# Patient Record
Sex: Female | Born: 1955 | Race: White | Hispanic: No | Marital: Married
Health system: Southern US, Community
[De-identification: ages and names within clinical notes are randomized; demographics above are authoritative.]

## PROBLEM LIST (undated history)

## (undated) HISTORY — PX: TONSILLECTOMY: SUR1361

---

## 2000-03-30 ENCOUNTER — Ambulatory Visit (HOSPITAL_COMMUNITY): Admission: RE | Admit: 2000-03-30 | Discharge: 2000-03-30 | Payer: Self-pay | Admitting: Obstetrics & Gynecology

## 2001-08-05 ENCOUNTER — Other Ambulatory Visit: Admission: RE | Admit: 2001-08-05 | Discharge: 2001-08-05 | Payer: Self-pay | Admitting: Obstetrics & Gynecology

## 2002-03-20 ENCOUNTER — Inpatient Hospital Stay (HOSPITAL_COMMUNITY): Admission: AD | Admit: 2002-03-20 | Discharge: 2002-03-23 | Payer: Self-pay | Admitting: Obstetrics & Gynecology

## 2002-03-24 ENCOUNTER — Encounter: Admission: RE | Admit: 2002-03-24 | Discharge: 2002-04-23 | Payer: Self-pay | Admitting: Obstetrics & Gynecology

## 2002-04-30 ENCOUNTER — Other Ambulatory Visit: Admission: RE | Admit: 2002-04-30 | Discharge: 2002-04-30 | Payer: Self-pay | Admitting: Obstetrics & Gynecology

## 2003-10-29 ENCOUNTER — Other Ambulatory Visit: Admission: RE | Admit: 2003-10-29 | Discharge: 2003-10-29 | Payer: Self-pay | Admitting: Obstetrics & Gynecology

## 2010-05-01 ENCOUNTER — Encounter: Payer: Self-pay | Admitting: Obstetrics & Gynecology

## 2013-01-22 ENCOUNTER — Other Ambulatory Visit: Payer: Self-pay | Admitting: Orthopedic Surgery

## 2013-01-22 DIAGNOSIS — M25551 Pain in right hip: Secondary | ICD-10-CM

## 2013-01-30 ENCOUNTER — Ambulatory Visit
Admission: RE | Admit: 2013-01-30 | Discharge: 2013-01-30 | Disposition: A | Discharge status: Home / Self Care | Payer: 59 | Source: Ambulatory Visit | Attending: Orthopedic Surgery | Admitting: Orthopedic Surgery

## 2013-01-30 DIAGNOSIS — M25551 Pain in right hip: Secondary | ICD-10-CM

## 2013-01-30 MED ORDER — IOHEXOL 180 MG/ML  SOLN
13.0000 mL | Freq: Once | INTRAMUSCULAR | Status: AC | PRN
  Administered 2013-01-30: 13 mL via INTRA_ARTICULAR

## 2013-04-01 ENCOUNTER — Encounter: Payer: Self-pay | Admitting: *Deleted

## 2013-04-08 ENCOUNTER — Ambulatory Visit: Payer: 59 | Admitting: Cardiovascular Disease

## 2014-08-02 IMAGING — RF DG FLUORO GUIDE NDL PLC/BX
2 series · 2 of 2 positions shown · non-contrast
Comparison: none

CLINICAL DATA: Right hip pain. Labral tear.

[Series 1: (hospital) · 1 of 1 slices shown (1 of 2)]
[im 1/1]
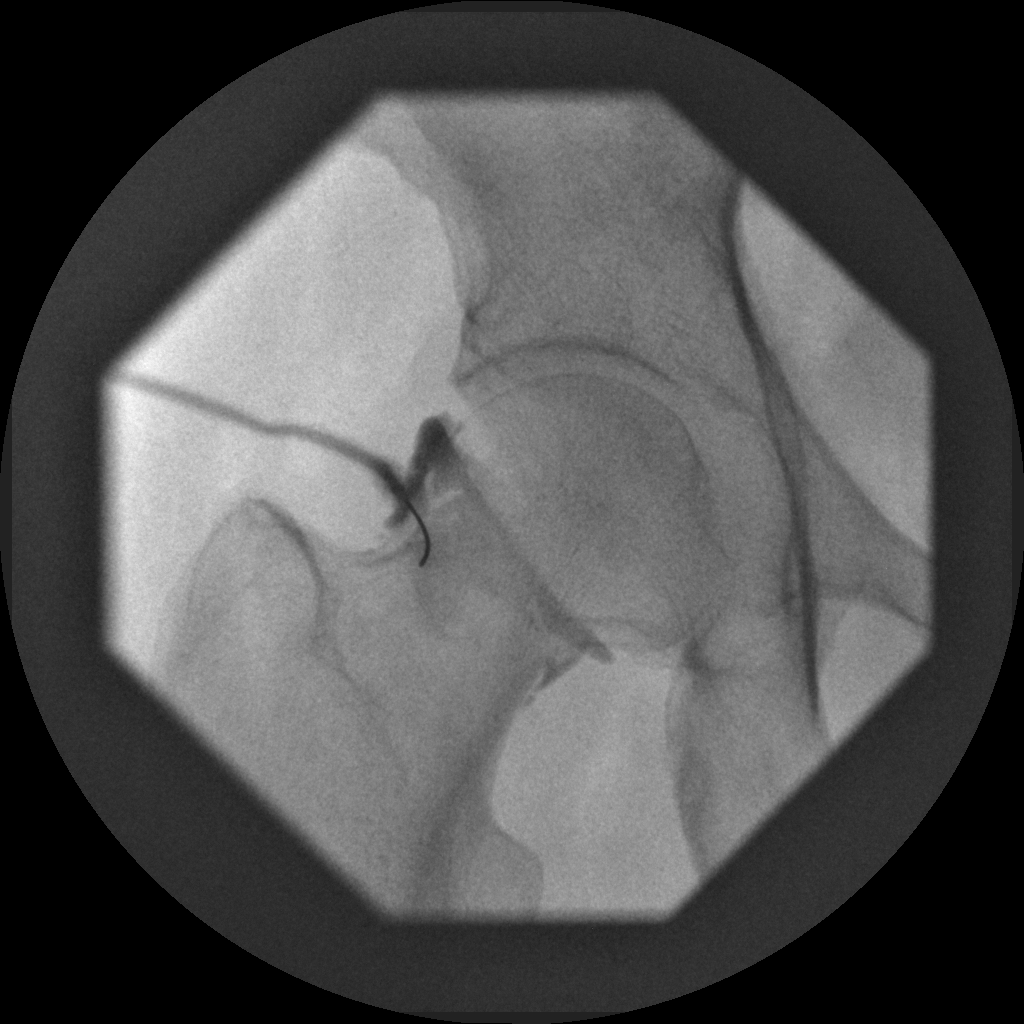

[Series 2: (hospital) · 1 of 1 slices shown (2 of 2)]
[im 1/1]
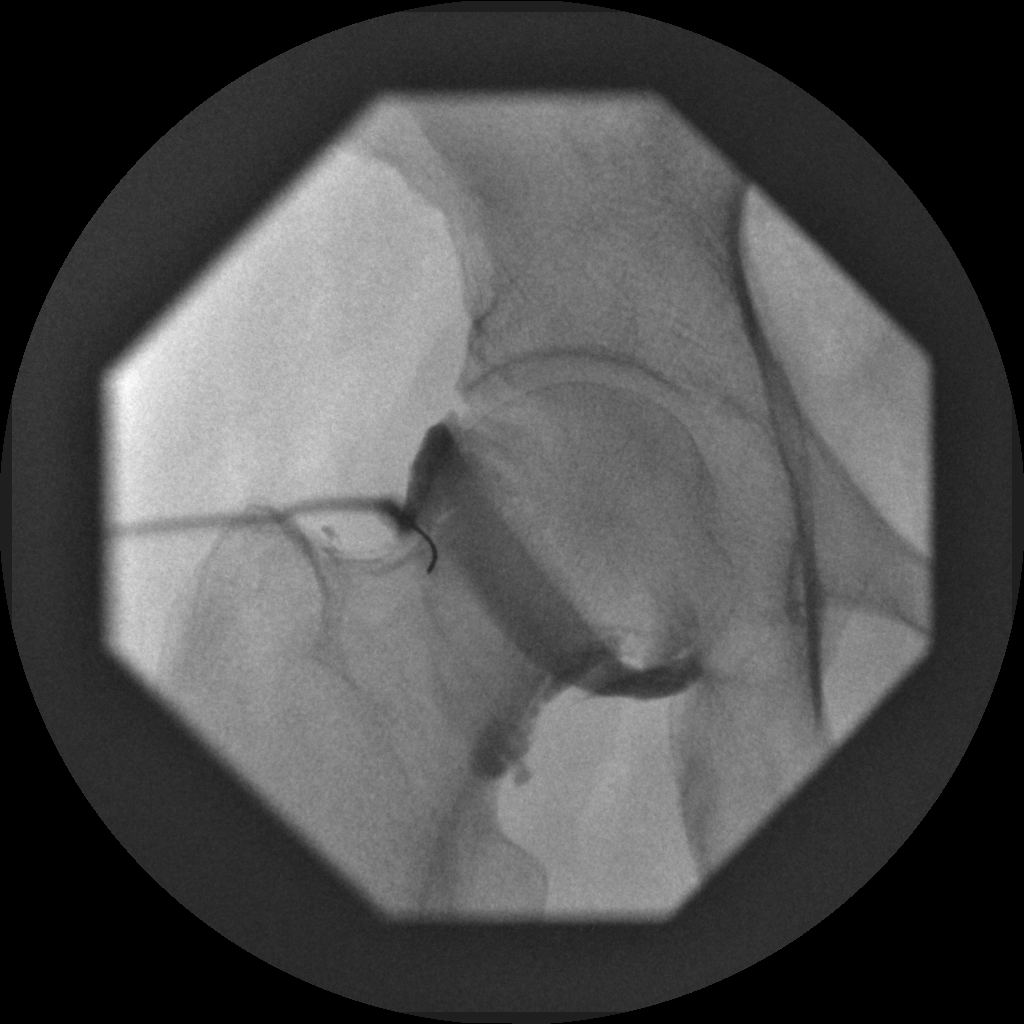

[2 of 2 positions shown; findings below may reference images not displayed]

EXAM:
HIP INJECTION FOR MRI

FLUOROSCOPY TIME:  0 min 24 seconds

PROCEDURE:
After a thorough discussion of risks and benefits of the procedure,
written and oral informed consent was obtained. The consent
discussion included off label use of Multihance, the risk of
bleeding, infection and injury to nerves and adjacent blood vessels.
Extra-articular injection was also a possible risk discussed. Verbal
consent was obtained by Dr. Xaparrito.

Preliminary localization was performed over the right hip. The area
was marked over the junction of the left femoral head and neck.

After prep and drape in the usual sterile fashion, the skin and
deeper subcutaneous tissues were anesthetized with 1% Lidocaine
without Epinephrine. Under fluoroscopic guidance, a 22 gauge
inch spinal needle was advanced into the joint at the lateral margin
of the junction of the femoral head and neck. Intra-articular
injection of Lidocaine was performed which flowed freely and
subsequently the joint was distended with 12 ml of a [DATE] dilution
of Multihance contrast. The MR arthrogram solution was as follows:
15 ml of omnipaque 180 contrast agent, 0.1 mL Multihance, 5 ml of 1%
Lidocaine. An end point was felt as well as the patient experiencing
pressure and the injection was discontinued, the needle removed, and
a sterile dressing applied. The patient was taken to MRI for
subsequent imaging.

The patient tolerated the procedure well and there were no
complications.
IMPRESSION: Successful right hip fluoroscopically guided injection.

## 2019-06-09 ENCOUNTER — Ambulatory Visit: Payer: Self-pay | Admitting: Internal Medicine

## 2019-06-11 ENCOUNTER — Encounter: Payer: Self-pay | Admitting: General Practice

## 2020-11-11 DIAGNOSIS — Z1231 Encounter for screening mammogram for malignant neoplasm of breast: Secondary | ICD-10-CM | POA: Diagnosis not present

## 2020-11-11 DIAGNOSIS — J40 Bronchitis, not specified as acute or chronic: Secondary | ICD-10-CM | POA: Diagnosis not present

## 2020-11-11 DIAGNOSIS — R058 Other specified cough: Secondary | ICD-10-CM | POA: Diagnosis not present

## 2020-11-11 DIAGNOSIS — Z6826 Body mass index (BMI) 26.0-26.9, adult: Secondary | ICD-10-CM | POA: Diagnosis not present

## 2020-11-11 DIAGNOSIS — N958 Other specified menopausal and perimenopausal disorders: Secondary | ICD-10-CM | POA: Diagnosis not present

## 2020-11-11 DIAGNOSIS — M8588 Other specified disorders of bone density and structure, other site: Secondary | ICD-10-CM | POA: Diagnosis not present

## 2020-11-11 DIAGNOSIS — Z124 Encounter for screening for malignant neoplasm of cervix: Secondary | ICD-10-CM | POA: Diagnosis not present

## 2021-01-20 DIAGNOSIS — R7989 Other specified abnormal findings of blood chemistry: Secondary | ICD-10-CM | POA: Diagnosis not present

## 2021-01-20 DIAGNOSIS — M859 Disorder of bone density and structure, unspecified: Secondary | ICD-10-CM | POA: Diagnosis not present

## 2021-01-20 DIAGNOSIS — Z0000 Encounter for general adult medical examination without abnormal findings: Secondary | ICD-10-CM | POA: Diagnosis not present

## 2021-01-27 DIAGNOSIS — Z8241 Family history of sudden cardiac death: Secondary | ICD-10-CM | POA: Diagnosis not present

## 2021-01-27 DIAGNOSIS — J449 Chronic obstructive pulmonary disease, unspecified: Secondary | ICD-10-CM | POA: Diagnosis not present

## 2021-01-27 DIAGNOSIS — Z0000 Encounter for general adult medical examination without abnormal findings: Secondary | ICD-10-CM | POA: Diagnosis not present

## 2021-01-27 DIAGNOSIS — J302 Other seasonal allergic rhinitis: Secondary | ICD-10-CM | POA: Diagnosis not present

## 2021-01-27 DIAGNOSIS — Z23 Encounter for immunization: Secondary | ICD-10-CM | POA: Diagnosis not present

## 2021-01-27 DIAGNOSIS — M8580 Other specified disorders of bone density and structure, unspecified site: Secondary | ICD-10-CM | POA: Diagnosis not present

## 2021-01-27 DIAGNOSIS — R82998 Other abnormal findings in urine: Secondary | ICD-10-CM | POA: Diagnosis not present

## 2021-01-27 DIAGNOSIS — E663 Overweight: Secondary | ICD-10-CM | POA: Diagnosis not present

## 2021-01-27 DIAGNOSIS — R5383 Other fatigue: Secondary | ICD-10-CM | POA: Diagnosis not present

## 2021-01-27 DIAGNOSIS — Z7689 Persons encountering health services in other specified circumstances: Secondary | ICD-10-CM | POA: Diagnosis not present

## 2021-02-22 ENCOUNTER — Other Ambulatory Visit (HOSPITAL_COMMUNITY): Payer: Self-pay | Admitting: Radiology

## 2021-02-22 DIAGNOSIS — J449 Chronic obstructive pulmonary disease, unspecified: Secondary | ICD-10-CM

## 2021-03-14 DIAGNOSIS — L738 Other specified follicular disorders: Secondary | ICD-10-CM | POA: Diagnosis not present

## 2021-03-14 DIAGNOSIS — L720 Epidermal cyst: Secondary | ICD-10-CM | POA: Diagnosis not present

## 2021-04-28 ENCOUNTER — Inpatient Hospital Stay (HOSPITAL_COMMUNITY): Admission: RE | Admit: 2021-04-28 | Payer: Self-pay | Source: Ambulatory Visit

## 2021-05-24 ENCOUNTER — Other Ambulatory Visit: Payer: Self-pay | Admitting: Internal Medicine

## 2021-05-24 LAB — SARS CORONAVIRUS 2 (TAT 6-24 HRS): SARS Coronavirus 2: NEGATIVE

## 2021-05-26 ENCOUNTER — Ambulatory Visit (HOSPITAL_COMMUNITY)
Admission: RE | Admit: 2021-05-26 | Discharge: 2021-05-26 | Disposition: A | Discharge status: Home / Self Care | Payer: PPO | Source: Ambulatory Visit | Attending: Internal Medicine | Admitting: Internal Medicine

## 2021-05-26 ENCOUNTER — Other Ambulatory Visit: Payer: Self-pay

## 2021-05-26 DIAGNOSIS — J449 Chronic obstructive pulmonary disease, unspecified: Secondary | ICD-10-CM | POA: Diagnosis not present

## 2021-05-26 LAB — PULMONARY FUNCTION TEST
DL/VA % pred: 91 %
DL/VA: 3.8 ml/min/mmHg/L
DLCO unc % pred: 86 %
DLCO unc: 17.23 ml/min/mmHg
FEF 25-75 Post: 2.31 L/sec
FEF 25-75 Pre: 2.21 L/sec
FEF2575-%Change-Post: 4 %
FEF2575-%Pred-Post: 108 %
FEF2575-%Pred-Pre: 103 %
FEV1-%Change-Post: 1 %
FEV1-%Pred-Post: 96 %
FEV1-%Pred-Pre: 95 %
FEV1-Post: 2.33 L
FEV1-Pre: 2.3 L
FEV1FVC-%Change-Post: 3 %
FEV1FVC-%Pred-Pre: 104 %
FEV6-%Change-Post: -1 %
FEV6-%Pred-Post: 92 %
FEV6-%Pred-Pre: 93 %
FEV6-Post: 2.82 L
FEV6-Pre: 2.85 L
FEV6FVC-%Pred-Post: 104 %
FEV6FVC-%Pred-Pre: 104 %
FVC-%Change-Post: -1 %
FVC-%Pred-Post: 89 %
FVC-%Pred-Pre: 90 %
FVC-Post: 2.82 L
FVC-Pre: 2.86 L
Post FEV1/FVC ratio: 83 %
Post FEV6/FVC ratio: 100 %
Pre FEV1/FVC ratio: 80 %
Pre FEV6/FVC Ratio: 100 %
RV % pred: 95 %
RV: 1.99 L
TLC % pred: 98 %
TLC: 4.98 L

## 2021-05-26 MED ORDER — ALBUTEROL SULFATE (2.5 MG/3ML) 0.083% IN NEBU
2.5000 mg | INHALATION_SOLUTION | Freq: Once | RESPIRATORY_TRACT | Status: AC
  Administered 2021-05-26: 2.5 mg via RESPIRATORY_TRACT

## 2021-08-12 DIAGNOSIS — Z23 Encounter for immunization: Secondary | ICD-10-CM | POA: Diagnosis not present

## 2021-10-19 ENCOUNTER — Ambulatory Visit (INDEPENDENT_AMBULATORY_CARE_PROVIDER_SITE_OTHER): Payer: PPO | Admitting: Podiatry

## 2021-10-19 DIAGNOSIS — Q828 Other specified congenital malformations of skin: Secondary | ICD-10-CM

## 2021-10-26 NOTE — Progress Notes (Signed)
  Subjective:  Patient ID: Tanya Mills, female    DOB: [birth year],  MRN: [id number]  Chief Complaint  Patient presents with   Callouses    66 y.o. female presents with the above complaint.  Patient presents with complaint of right submetatarsal 2 porokeratotic lesion.  Patient states is painful to touch is progressive, she has not seen anyone as prior to seeing me.  She will get it evaluated.  With ambulation.  Been on for 3 months.   Review of Systems: Negative except as noted in the HPI. Denies N/V/F/Ch.  No past medical history on file.  Current Outpatient Medications:    calcium carbonate 200 MG capsule, , Disp: , Rfl:    clindamycin (CLEOCIN T) 1 % external solution, SMARTSIG:1 sparingly Topical Twice Daily, Disp: , Rfl:    Estrogens Conjugated (PREMARIN PO), Take by mouth., Disp: , Rfl:    Multiple Vitamin (MULTIVITAMIN) tablet, Take 1 tablet by mouth daily., Disp: , Rfl:    progesterone (PROMETRIUM) 100 MG capsule, Take 100 mg by mouth daily., Disp: , Rfl:    Progesterone Micronized (PROGESTERONE PO), Take by mouth., Disp: , Rfl:   Social History   Tobacco Use  Smoking Status Not on file  Smokeless Tobacco Not on file    Not on File Objective:  There were no vitals filed for this visit. There is no height or weight on file to calculate BMI. Constitutional Well developed. Well nourished.  Vascular Dorsalis pedis pulses palpable bilaterally. Posterior tibial pulses palpable bilaterally. Capillary refill normal to all digits.  No cyanosis or clubbing noted. Pedal hair growth normal.  Neurologic Normal speech. Oriented to person, place, and time. Epicritic sensation to light touch grossly present bilaterally.  Dermatologic Hyperkeratotic lesion with central nucleated core noted to submetatarsal 2 pain on palpation to the lesion.  Orthopedic: Normal joint ROM without pain or crepitus bilaterally. No visible deformities. No bony tenderness.   Radiographs:  None Assessment:   1. Porokeratosis    Plan:  Patient was evaluated and treated and all questions answered.  Right submetatarsal 2 porokeratotic lesion -All questions and concerns were discussed with patient in extensive detail -Given the amount of pain that she is having hyperkeratotic lesion was debrided down to healthy scar tissue no complication noted.  The central nucleated core was excised out.  No complication noted no pinpoint bleeding noted -If there is no improvement we will discuss orthotics management  No follow-ups on file.

## 2021-11-15 DIAGNOSIS — Z01419 Encounter for gynecological examination (general) (routine) without abnormal findings: Secondary | ICD-10-CM | POA: Diagnosis not present

## 2021-11-15 DIAGNOSIS — N959 Unspecified menopausal and perimenopausal disorder: Secondary | ICD-10-CM | POA: Diagnosis not present

## 2021-11-15 DIAGNOSIS — Z6824 Body mass index (BMI) 24.0-24.9, adult: Secondary | ICD-10-CM | POA: Diagnosis not present

## 2021-11-15 DIAGNOSIS — Z1231 Encounter for screening mammogram for malignant neoplasm of breast: Secondary | ICD-10-CM | POA: Diagnosis not present

## 2022-02-07 DIAGNOSIS — R7989 Other specified abnormal findings of blood chemistry: Secondary | ICD-10-CM | POA: Diagnosis not present

## 2022-02-07 DIAGNOSIS — M8580 Other specified disorders of bone density and structure, unspecified site: Secondary | ICD-10-CM | POA: Diagnosis not present

## 2022-02-07 DIAGNOSIS — Z0000 Encounter for general adult medical examination without abnormal findings: Secondary | ICD-10-CM | POA: Diagnosis not present

## 2022-02-07 DIAGNOSIS — R5383 Other fatigue: Secondary | ICD-10-CM | POA: Diagnosis not present

## 2022-02-14 DIAGNOSIS — R5383 Other fatigue: Secondary | ICD-10-CM | POA: Diagnosis not present

## 2022-02-14 DIAGNOSIS — J449 Chronic obstructive pulmonary disease, unspecified: Secondary | ICD-10-CM | POA: Diagnosis not present

## 2022-02-14 DIAGNOSIS — H02403 Unspecified ptosis of bilateral eyelids: Secondary | ICD-10-CM | POA: Diagnosis not present

## 2022-02-14 DIAGNOSIS — Z0000 Encounter for general adult medical examination without abnormal findings: Secondary | ICD-10-CM | POA: Diagnosis not present

## 2022-02-14 DIAGNOSIS — M8580 Other specified disorders of bone density and structure, unspecified site: Secondary | ICD-10-CM | POA: Diagnosis not present

## 2022-02-14 DIAGNOSIS — J302 Other seasonal allergic rhinitis: Secondary | ICD-10-CM | POA: Diagnosis not present

## 2022-02-14 DIAGNOSIS — Z23 Encounter for immunization: Secondary | ICD-10-CM | POA: Diagnosis not present

## 2022-06-07 DIAGNOSIS — Z0100 Encounter for examination of eyes and vision without abnormal findings: Secondary | ICD-10-CM | POA: Diagnosis not present

## 2022-08-07 DIAGNOSIS — D123 Benign neoplasm of transverse colon: Secondary | ICD-10-CM | POA: Diagnosis not present

## 2022-08-07 DIAGNOSIS — K635 Polyp of colon: Secondary | ICD-10-CM | POA: Diagnosis not present

## 2022-08-07 DIAGNOSIS — K5730 Diverticulosis of large intestine without perforation or abscess without bleeding: Secondary | ICD-10-CM | POA: Diagnosis not present

## 2022-08-07 DIAGNOSIS — Z1211 Encounter for screening for malignant neoplasm of colon: Secondary | ICD-10-CM | POA: Diagnosis not present

## 2022-09-27 DIAGNOSIS — L299 Pruritus, unspecified: Secondary | ICD-10-CM | POA: Diagnosis not present

## 2022-09-27 DIAGNOSIS — I87393 Chronic venous hypertension (idiopathic) with other complications of bilateral lower extremity: Secondary | ICD-10-CM | POA: Diagnosis not present

## 2022-09-27 DIAGNOSIS — I872 Venous insufficiency (chronic) (peripheral): Secondary | ICD-10-CM | POA: Diagnosis not present

## 2022-09-27 DIAGNOSIS — R600 Localized edema: Secondary | ICD-10-CM | POA: Diagnosis not present

## 2022-12-18 DIAGNOSIS — Z124 Encounter for screening for malignant neoplasm of cervix: Secondary | ICD-10-CM | POA: Diagnosis not present

## 2022-12-18 DIAGNOSIS — Z1231 Encounter for screening mammogram for malignant neoplasm of breast: Secondary | ICD-10-CM | POA: Diagnosis not present

## 2022-12-18 DIAGNOSIS — Z6823 Body mass index (BMI) 23.0-23.9, adult: Secondary | ICD-10-CM | POA: Diagnosis not present

## 2022-12-18 DIAGNOSIS — N958 Other specified menopausal and perimenopausal disorders: Secondary | ICD-10-CM | POA: Diagnosis not present

## 2022-12-18 DIAGNOSIS — M8588 Other specified disorders of bone density and structure, other site: Secondary | ICD-10-CM | POA: Diagnosis not present

## 2022-12-18 DIAGNOSIS — N951 Menopausal and female climacteric states: Secondary | ICD-10-CM | POA: Diagnosis not present

## 2022-12-18 DIAGNOSIS — N959 Unspecified menopausal and perimenopausal disorder: Secondary | ICD-10-CM | POA: Diagnosis not present

## 2023-02-13 DIAGNOSIS — Z1389 Encounter for screening for other disorder: Secondary | ICD-10-CM | POA: Diagnosis not present

## 2023-02-20 DIAGNOSIS — Z1339 Encounter for screening examination for other mental health and behavioral disorders: Secondary | ICD-10-CM | POA: Diagnosis not present

## 2023-02-20 DIAGNOSIS — J449 Chronic obstructive pulmonary disease, unspecified: Secondary | ICD-10-CM | POA: Diagnosis not present

## 2023-02-20 DIAGNOSIS — Z1331 Encounter for screening for depression: Secondary | ICD-10-CM | POA: Diagnosis not present

## 2023-02-20 DIAGNOSIS — R5383 Other fatigue: Secondary | ICD-10-CM | POA: Diagnosis not present

## 2023-02-20 DIAGNOSIS — E785 Hyperlipidemia, unspecified: Secondary | ICD-10-CM | POA: Diagnosis not present

## 2023-02-20 DIAGNOSIS — Z0000 Encounter for general adult medical examination without abnormal findings: Secondary | ICD-10-CM | POA: Diagnosis not present

## 2023-02-20 DIAGNOSIS — Z23 Encounter for immunization: Secondary | ICD-10-CM | POA: Diagnosis not present

## 2023-02-20 DIAGNOSIS — M8580 Other specified disorders of bone density and structure, unspecified site: Secondary | ICD-10-CM | POA: Diagnosis not present

## 2023-02-21 ENCOUNTER — Other Ambulatory Visit: Payer: Self-pay | Admitting: Internal Medicine

## 2023-02-21 DIAGNOSIS — E785 Hyperlipidemia, unspecified: Secondary | ICD-10-CM

## 2023-03-01 ENCOUNTER — Other Ambulatory Visit: Payer: HMO

## 2023-03-05 ENCOUNTER — Ambulatory Visit
Admission: RE | Admit: 2023-03-05 | Discharge: 2023-03-05 | Disposition: A | Discharge status: Home / Self Care | Payer: No Typology Code available for payment source | Source: Ambulatory Visit | Attending: Internal Medicine | Admitting: Internal Medicine

## 2023-03-05 DIAGNOSIS — E785 Hyperlipidemia, unspecified: Secondary | ICD-10-CM

## 2023-11-01 DIAGNOSIS — D225 Melanocytic nevi of trunk: Secondary | ICD-10-CM | POA: Diagnosis not present

## 2023-11-01 DIAGNOSIS — L814 Other melanin hyperpigmentation: Secondary | ICD-10-CM | POA: Diagnosis not present

## 2023-11-01 DIAGNOSIS — D2272 Melanocytic nevi of left lower limb, including hip: Secondary | ICD-10-CM | POA: Diagnosis not present

## 2023-11-01 DIAGNOSIS — L820 Inflamed seborrheic keratosis: Secondary | ICD-10-CM | POA: Diagnosis not present

## 2023-11-01 DIAGNOSIS — L821 Other seborrheic keratosis: Secondary | ICD-10-CM | POA: Diagnosis not present

## 2023-11-01 DIAGNOSIS — D1801 Hemangioma of skin and subcutaneous tissue: Secondary | ICD-10-CM | POA: Diagnosis not present

## 2023-11-01 DIAGNOSIS — L738 Other specified follicular disorders: Secondary | ICD-10-CM | POA: Diagnosis not present

## 2023-11-01 DIAGNOSIS — D2271 Melanocytic nevi of right lower limb, including hip: Secondary | ICD-10-CM | POA: Diagnosis not present

## 2023-12-19 DIAGNOSIS — H43393 Other vitreous opacities, bilateral: Secondary | ICD-10-CM | POA: Diagnosis not present

## 2023-12-19 DIAGNOSIS — H524 Presbyopia: Secondary | ICD-10-CM | POA: Diagnosis not present

## 2023-12-20 DIAGNOSIS — Z1231 Encounter for screening mammogram for malignant neoplasm of breast: Secondary | ICD-10-CM | POA: Diagnosis not present

## 2023-12-20 DIAGNOSIS — Z01419 Encounter for gynecological examination (general) (routine) without abnormal findings: Secondary | ICD-10-CM | POA: Diagnosis not present

## 2023-12-20 DIAGNOSIS — Z6823 Body mass index (BMI) 23.0-23.9, adult: Secondary | ICD-10-CM | POA: Diagnosis not present

## 2024-02-14 DIAGNOSIS — R5383 Other fatigue: Secondary | ICD-10-CM | POA: Diagnosis not present

## 2024-02-14 DIAGNOSIS — E785 Hyperlipidemia, unspecified: Secondary | ICD-10-CM | POA: Diagnosis not present

## 2024-02-21 DIAGNOSIS — J302 Other seasonal allergic rhinitis: Secondary | ICD-10-CM | POA: Diagnosis not present

## 2024-02-21 DIAGNOSIS — Z0000 Encounter for general adult medical examination without abnormal findings: Secondary | ICD-10-CM | POA: Diagnosis not present

## 2024-02-21 DIAGNOSIS — E785 Hyperlipidemia, unspecified: Secondary | ICD-10-CM | POA: Diagnosis not present

## 2024-02-21 DIAGNOSIS — Z1339 Encounter for screening examination for other mental health and behavioral disorders: Secondary | ICD-10-CM | POA: Diagnosis not present

## 2024-02-21 DIAGNOSIS — J449 Chronic obstructive pulmonary disease, unspecified: Secondary | ICD-10-CM | POA: Diagnosis not present

## 2024-02-21 DIAGNOSIS — R5383 Other fatigue: Secondary | ICD-10-CM | POA: Diagnosis not present

## 2024-02-21 DIAGNOSIS — Z23 Encounter for immunization: Secondary | ICD-10-CM | POA: Diagnosis not present

## 2024-02-21 DIAGNOSIS — N3941 Urge incontinence: Secondary | ICD-10-CM | POA: Diagnosis not present

## 2024-02-21 DIAGNOSIS — E663 Overweight: Secondary | ICD-10-CM | POA: Diagnosis not present

## 2024-02-21 DIAGNOSIS — M8580 Other specified disorders of bone density and structure, unspecified site: Secondary | ICD-10-CM | POA: Diagnosis not present

## 2024-02-21 DIAGNOSIS — M7581 Other shoulder lesions, right shoulder: Secondary | ICD-10-CM | POA: Diagnosis not present

## 2024-02-21 DIAGNOSIS — Z1331 Encounter for screening for depression: Secondary | ICD-10-CM | POA: Diagnosis not present
